# Patient Record
Sex: Female | Born: 2005 | Race: Black or African American | Hispanic: No | Marital: Single | Smoking: Never smoker
Health system: Southern US, Community
[De-identification: ages and names within clinical notes are randomized; demographics above are authoritative.]

## PROBLEM LIST (undated history)

## (undated) DIAGNOSIS — F419 Anxiety disorder, unspecified: Secondary | ICD-10-CM

## (undated) DIAGNOSIS — T7840XA Allergy, unspecified, initial encounter: Secondary | ICD-10-CM

## (undated) DIAGNOSIS — J45909 Unspecified asthma, uncomplicated: Secondary | ICD-10-CM

## (undated) HISTORY — DX: Unspecified asthma, uncomplicated: J45.909

## (undated) HISTORY — DX: Allergy, unspecified, initial encounter: T78.40XA

## (undated) HISTORY — PX: TYMPANOSTOMY TUBE PLACEMENT: SHX32

## (undated) HISTORY — DX: Anxiety disorder, unspecified: F41.9

---
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
## 2023-05-26 ENCOUNTER — Telehealth (INDEPENDENT_AMBULATORY_CARE_PROVIDER_SITE_OTHER): Payer: Self-pay | Admitting: Pediatrics

## 2023-05-26 NOTE — Telephone Encounter (Signed)
Contacted patient's mother. Verified patients name and DOB as well as mothers name.   Informed mother that Florene would need a referral in order to be seen gain and that referral would need to be sent to adult neuro.   SS, CCMA

## 2023-05-26 NOTE — Telephone Encounter (Signed)
  Name of who is calling: Alene Mires  Caller's Relationship to Patient: Mom  Best contact number: [phone]  Provider they see: Dr. Artis Flock  Reason for call: Mom wants to know if referral can be put in for adult neurology, pt is unable to be seen by here PCP due to her age and she is needing a new PCP. Mom wants to know since pt is in college if she can still be seen by Dr. Artis Flock?      PRESCRIPTION REFILL ONLY  Name of prescription:  Pharmacy:

## 2023-06-18 ENCOUNTER — Ambulatory Visit
Admission: RE | Admit: 2023-06-18 | Discharge: 2023-06-18 | Disposition: A | Discharge status: Home / Self Care | Payer: No Typology Code available for payment source | Source: Ambulatory Visit | Attending: Internal Medicine | Admitting: Internal Medicine

## 2023-06-18 VITALS — BP 121/80 | HR 73 | Temp 98.5°F | Ht 61.0 in | Wt 120.0 lb

## 2023-06-18 DIAGNOSIS — J069 Acute upper respiratory infection, unspecified: Secondary | ICD-10-CM

## 2023-06-18 DIAGNOSIS — H6593 Unspecified nonsuppurative otitis media, bilateral: Secondary | ICD-10-CM

## 2023-06-18 MED ORDER — FLUTICASONE PROPIONATE 50 MCG/ACT NA SUSP: 1.0000 | Freq: Every day | NASAL | 0 refills | Status: AC

## 2023-06-18 MED ORDER — AMOXICILLIN-POT CLAVULANATE 875-125 MG PO TABS: 1.0000 | ORAL_TABLET | Freq: Two times a day (BID) | ORAL | 0 refills | Status: AC

## 2023-06-18 NOTE — Discharge Instructions (Signed)
I have prescribed an antibiotic and a nasal spray to help alleviate symptoms.  If symptoms persist or worsen, please follow-up for further evaluation and management.

## 2023-06-18 NOTE — ED Provider Notes (Signed)
Melissa Carey    CSN: [id number] Arrival date & time: 06/18/23  1550      History   Chief Complaint Chief Complaint  Patient presents with   Ear Fullness    Ear discomfort, congestion in ear and nose,headache - Entered by patient    HPI Melissa Carey is a 17 y.o. female.   Patient presents with nasal congestion, sinus pressure, intermittent headaches, right ear discomfort that has been coming and going for a few weeks.  Reports very mild nonproductive coughing, if any.  Denies any fever, body aches, chills.  Denies any known sick contacts.  Patient not reporting any chest pain or shortness of breath.  Patient has been treating with allergy medicine with minimal improvement.  Denies history of previous sinus related issues.   Ear Fullness    Past Medical History:  Diagnosis Date   Allergy    [employer] 03/14/2020   Anxiety    [employer] 03/14/2020   Asthma    [employer] 03/14/2020    Patient Active Problem List   Diagnosis Date Noted   Generalized anxiety disorder 12/21/2018   Chronic migraine without aura with status migrainosus, not intractable 03/07/2018   Tension headache 03/07/2018   Anxiety state 03/07/2018    Past Surgical History:  Procedure Laterality Date   TYMPANOSTOMY TUBE PLACEMENT      OB History   No obstetric history on file.      Home Medications    Prior to Admission medications   Medication Sig Start Date End Date Taking? Authorizing Provider  acetaminophen (TYLENOL) 325 MG tablet Take 650 mg by mouth every 6 (six) hours as needed.    [provider]  albuterol (PROVENTIL HFA;VENTOLIN HFA) 108 (90 Base) MCG/ACT inhaler Inhale into the lungs every 6 (six) hours as needed for wheezing or shortness of breath.    [provider]  amitriptyline (ELAVIL) 25 MG tablet TAKE 2 TABLETS BY MOUTH AT BEDTIME. 11/24/21   Margurite Auerbach, MD  amoxicillin-clavulanate (AUGMENTIN) 875-125 MG tablet Take 1 tablet by mouth  every 12 (twelve) hours. 06/18/23  Yes Rebekah Sprinkle, Rolly Salter E, FNP  budesonide (PULMICORT) 180 MCG/ACT inhaler Inhale into the lungs 2 (two) times daily.    [provider]  cetirizine (ZYRTEC) 10 MG tablet Take by mouth. 11/15/17   [provider]  cyclobenzaprine (FLEXERIL) 5 MG tablet 1-2 tablets as needed every 8 hours for muscle tension and headache 10/07/20   Margurite Auerbach, MD  esomeprazole (NEXIUM) 40 MG capsule Take by mouth daily. Patient not taking: No sig reported 02/21/18   [provider]  fluticasone (FLONASE) 50 MCG/ACT nasal spray Place 1 spray into both nostrils daily. 06/18/23  Yes Larena Ohnemus, Rolly Salter E, FNP  gabapentin (NEURONTIN) 100 MG capsule Take 1 capsule in the morning, 1 in the afternoon, and 3 in the evening. 12/08/21   Margurite Auerbach, MD  ibuprofen (ADVIL,MOTRIN) 600 MG tablet Take 1 tablet (600 mg total) by mouth every 6 (six) hours as needed. 05/20/18   Margurite Auerbach, MD  montelukast (SINGULAIR) 5 MG chewable tablet TAKE 1 TABLET BY MOUTH EVERYDAY AT BEDTIME 11/15/17   [provider]  Multiple Vitamin (MULTIVITAMIN) tablet Take 1 tablet by mouth daily.    [provider]  rizatriptan (MAXALT) 10 MG tablet Take 1 tablet (10 mg total) by mouth as needed for migraine. May repeat in 2 hours if needed 09/17/21   Margurite Auerbach, MD  Vitamin D, Cholecalciferol, 25 MCG (1000  UT) TABS Take 1 tablet by mouth daily. 07/07/19   Margurite Auerbach, MD    Family History Family History  Problem Relation Age of Onset   Migraines Mother    Migraines Father    Anxiety disorder Father    Seizures Neg Hx    Autism Neg Hx    ADD / ADHD Neg Hx    Depression Neg Hx    Bipolar disorder Neg Hx    Schizophrenia Neg Hx     Social History Social History   Tobacco Use   Smoking status: Never   Smokeless tobacco: Never  Substance Use Topics   Alcohol use: Not Currently   Drug use: Never     Allergies   Eggshell membrane (chicken)  [egg  shells]   Review of Systems Review of Systems Per HPI  Physical Exam Triage Vital Signs ED Triage Vitals  Encounter Vitals Group     BP 06/18/23 1625 121/80     Systolic BP Percentile --      Diastolic BP Percentile --      Pulse Rate 06/18/23 1637 73     Resp --      Temp 06/18/23 1625 98.5 F (36.9 C)     Temp Source 06/18/23 1625 Oral     SpO2 06/18/23 1625 100 %     Weight 06/18/23 1621 120 lb (54.4 kg)     Height 06/18/23 1621 5\' 1"  (1.549 m)     Head Circumference --      Peak Flow --      Pain Score 06/18/23 1621 4     Pain Loc --      Pain Education --      Exclude from Growth Chart --    No data found.  Updated Vital Signs BP 121/80 (BP Location: Left Arm)   Pulse 73   Temp 98.5 F (36.9 C) (Oral)   Ht 5\' 1"  (1.549 m)   Wt 120 lb (54.4 kg)   LMP 05/24/2023   SpO2 100%   BMI 22.67 kg/m   Visual Acuity Right Eye Distance:   Left Eye Distance:   Bilateral Distance:    Right Eye Near:   Left Eye Near:    Bilateral Near:     Physical Exam Constitutional:      General: She is not in acute distress.    Appearance: Normal appearance. She is not toxic-appearing or diaphoretic.  HENT:     Head: Normocephalic and atraumatic.     Right Ear: Ear canal normal. No drainage, swelling or tenderness. A middle ear effusion is present. Tympanic membrane is not perforated, erythematous or bulging.     Left Ear: Ear canal normal. No drainage, swelling or tenderness. A middle ear effusion is present. Tympanic membrane is not perforated, erythematous or bulging.     Nose: Congestion present.     Mouth/Throat:     Mouth: Mucous membranes are moist.     Pharynx: No posterior oropharyngeal erythema.  Eyes:     Extraocular Movements: Extraocular movements intact.     Conjunctiva/sclera: Conjunctivae normal.     Pupils: Pupils are equal, round, and reactive to light.  Cardiovascular:     Rate and Rhythm: Normal rate and regular rhythm.     Pulses: Normal pulses.      Heart sounds: Normal heart sounds.  Pulmonary:     Effort: Pulmonary effort is normal. No respiratory distress.     Breath sounds: Normal breath sounds. No stridor.  No wheezing, rhonchi or rales.  Musculoskeletal:        General: Normal range of motion.     Cervical back: Normal range of motion.  Skin:    General: Skin is warm and dry.  Neurological:     General: No focal deficit present.     Mental Status: She is alert and oriented to person, place, and time. Mental status is at baseline.  Psychiatric:        Mood and Affect: Mood normal.        Behavior: Behavior normal.      UC Treatments / Results  Labs (all labs ordered are listed, but only abnormal results are displayed) Labs Reviewed - No data to display  EKG   Radiology No results found.  Procedures Procedures (including critical Carey time)  Medications Ordered in UC Medications - No data to display  Initial Impression / Assessment and Plan / UC Course  I have reviewed the triage vital signs and the nursing notes.  Pertinent labs & imaging results that were available during my Carey of the patient were reviewed by me and considered in my medical decision making (see chart for details).     Possibly allergic rhinitis but given duration of symptoms, headaches, sinus pressure, fluid behind TMs will treat with Augmentin antibiotic to cover for sinus infection.  Flonase also prescribed to help alleviate nasal congestion, sinus pressure, fluid behind TMs.  Advised to follow-up if any symptoms persist or worsen.  Patient verbalized understanding and was agreeable with plan. Final Clinical Impressions(s) / UC Diagnoses   Final diagnoses:  Acute upper respiratory infection  Fluid level behind tympanic membrane of both ears     Discharge Instructions      I have prescribed an antibiotic and a nasal spray to help alleviate symptoms.  If symptoms persist or worsen, please follow-up for further evaluation and  management.    ED Prescriptions     Medication Sig Dispense Auth. Provider   amoxicillin-clavulanate (AUGMENTIN) 875-125 MG tablet Take 1 tablet by mouth every 12 (twelve) hours. 14 tablet Kerrick, [place] E, [place]   fluticasone [facility]) 50 MCG/ACT nasal spray Place 1 spray into both nostrils daily. 16 g Gustavus Bryant, [place]      PDMP not reviewed this encounter.   Gustavus Bryant, [place] 06/18/23 [phone]

## 2023-06-18 NOTE — ED Triage Notes (Signed)
Patient presents with congestion on right side, discomfort in right ear and migraines. Symptoms has been since the beginning of October, come and goes. Treated with Zyrtec.
